# Patient Record
Sex: Female | Born: 1995 | Race: White | Hispanic: No | Marital: Single
Health system: Southern US, Community
[De-identification: ages and names within clinical notes are randomized; demographics above are authoritative.]

---
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
## 2018-11-21 ENCOUNTER — Telehealth: Payer: Self-pay | Admitting: *Deleted

## 2018-11-21 NOTE — Telephone Encounter (Signed)
Received a message from pt mother Lark Langenfeld who is an established pt with Dr. Lindi Adie.  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  Per Dr. Lindi Adie, make a WebEx apt for pt for him to assess pt and to decide what to do based on assessment.  High priority message sent to scheduling for 4/2//2020 at 10:15 am.

## 2018-11-22 ENCOUNTER — Telehealth: Payer: Self-pay | Admitting: Hematology and Oncology

## 2018-11-22 NOTE — Telephone Encounter (Signed)
Lf the pt a vm to schedule an appt w/Dr.Gudena

## 2018-11-28 ENCOUNTER — Telehealth: Payer: Self-pay | Admitting: Hematology and Oncology

## 2018-11-28 NOTE — Telephone Encounter (Signed)
Called patient regarding upcoming Webex appointment, patient is notified and an e-mail has been sent.

## 2018-11-28 NOTE — Telephone Encounter (Signed)
A new high risk appt has been scheduled for the pt to see Dr. Pamelia Hoit on 4/22 at 1030am. Appt date and time has been given to the pt's mom. Will have Alli or Beverly set up a webex visit.

## 2018-11-28 NOTE — Progress Notes (Signed)
HEMATOLOGY-ONCOLOGY [place] VISIT CONSULT NOTE  I connected with Jenna Mcdaniel on 11/29/2018 at 10:30 AM EDT by Webex video conference and verified that I am speaking with the correct person using two identifiers.  I discussed the limitations, risks, security and privacy concerns of performing an evaluation and management service by Webex and the availability of in person appointments.  I also discussed with the patient that there may be a patient responsible charge related to this service. The patient expressed understanding and agreed to proceed.  Patient's Location: Home Physician Location: Clinic  Patient Care Team: Patient, No Pcp Per as PCP - General (General Practice)   CHIEF COMPLIANT/PURPOSE OF CONSULTATION: Newly diagnosed high risk for breast cancer  HISTORY OF PRESENTING ILLNESS: Jenna Mcdaniel is a 23 y.o. female who presents today due to a recent diagnosis of high risk for breast cancer. She was referred by her mother, who is a patient of mine, and has a history of breast cancer, positive for the PALB2 mutation. She presents today over Webex for initial evaluation.   I reviewed her records extensively and collaborated the history with the patient.  MEDICAL HISTORY: No past medical problems SURGICAL HISTORY: No prior surgeries  SOCIAL HISTORY: Denies any tobacco alcohol or recreational drug use FAMILY HISTORY: Mother with PALB 2 mutation ALLERGIES: is allergic to has no allergies on file.  MEDICATIONS: Does not take any medications  REVIEW OF SYSTEMS:   Constitutional: Denies fevers, chills or abnormal weight loss Eyes: Denies blurriness of vision Ears, nose, mouth, throat, and face: Denies mucositis or sore throat Respiratory: Denies cough, dyspnea or wheezes Cardiovascular: Denies palpitation, chest discomfort Gastrointestinal:  Denies nausea, heartburn or change in bowel habits Skin: Denies abnormal skin rashes Lymphatics: Denies new lymphadenopathy or easy bruising  Neurological:Denies numbness, tingling or new weaknesses Behavioral/Psych: Mood is stable, no new changes  Extremities: No lower extremity edema Breast: Lumps in bilateral breasts for the past 2 years that are stable All other systems were reviewed with the patient and are negative.  Observations/Objective: Stable lumps in bilateral breast for the past 2 years  No results found for: WBC, HGB, HCT, MCV, PLT, NEUTROABS    Assessment Plan:  At high risk for breast cancer Patients mom with PALB2 mutation and breast cancer. (patient of mine).  Breast lumps: 1. Mammograms and ultrasound will be ordered for evaluation of the cysts in the breast 2. Breast MRI: Only if she is positive for BRCA mutation 3. Genetics evaluation and testing: I discussed with Santiago Glad who is willing to set up a video visit for them.  I educated the patient about PALB 2 mutation  PALB2 (partner and localizer of BRCA2):  It is a gene that functions in DNA double strand brake repair similar to BRCA gene. It encodes a protein that functions in genome maintenance.  Clinical significance: 1.  Risk of breast cancer:The risk of breast cancer for female PALB2 mutation carriers, as compared with the general population, with 8-9 times as high among those younger than 25 years of age, 6-8 times as high among those 67-7 years of age, and 5 times as high among those older than 25 years of age. The cumulative breast cancer risk among female PALB2 mutation carriers is estimated to be increased by 2-4 fold by age 41, which translates to an 18-35% risk of breast cancer by age 39 based on a general female population risk of 8.8% to age 53.  2.  Risk of pancreatic cancer: The exact cumulative pancreatic cancer  risk among PALB2 mutation carriers has not been determined. The magnitude of risk increases with the number of affected relatives, with the highest risk (32-fold) in individuals with three affected first-degree relatives.  3. Risk  of Ovarian cancer:  Also not properly documented how much the increased risk is.  I discussed with the patient that if she does not have the mutation then she does not need annual MRIs.  She can go to standard surveillance with mammograms starting at age 23. If she does have the mutation then either as we or her PCP/GYN can order these tests annually for surveillance   I discussed the assessment and treatment plan with the patient. The patient was provided an opportunity to ask questions and all were answered. The patient agreed with the plan and demonstrated an understanding of the instructions. The patient was advised to call back or seek an in-person evaluation if the symptoms worsen or if the condition fails to improve as anticipated.   I provided 26 minutes of face-to-face Web Ex time during this encounter.    Rulon Eisenmenger, MD 11/29/2018   I, Molly Dorshimer, am acting as scribe for Nicholas Lose, MD.  I have reviewed the above documentation for accuracy and completeness, and I agree with the above.

## 2018-11-29 ENCOUNTER — Other Ambulatory Visit: Payer: Self-pay | Admitting: Hematology and Oncology

## 2018-11-29 ENCOUNTER — Inpatient Hospital Stay: Payer: 59 | Attending: Hematology and Oncology | Admitting: Hematology and Oncology

## 2018-11-29 DIAGNOSIS — N6001 Solitary cyst of right breast: Secondary | ICD-10-CM

## 2018-11-29 DIAGNOSIS — N6002 Solitary cyst of left breast: Secondary | ICD-10-CM

## 2018-11-29 DIAGNOSIS — N63 Unspecified lump in unspecified breast: Secondary | ICD-10-CM

## 2018-11-29 DIAGNOSIS — Z9189 Other specified personal risk factors, not elsewhere classified: Secondary | ICD-10-CM

## 2018-11-29 NOTE — Assessment & Plan Note (Signed)
Patients mom with PALB2 mutation and breast cancer. (patient of mine). Breast lumps: 1. Mammograms 2. Breast MRI 3. Genetics evaluation and testing  PALB2 (partner and localizer of BRCA2):  It is a gene that functions in DNA double strand brake repair similar to BRCA gene. It encodes a protein that functions in genome maintenance.  Clinical significance: 1.  Risk of breast cancer:The risk of breast cancer for female PALB2 mutation carriers, as compared with the general population, with 8-9 times as high among those younger than 25 years of age, 6-8 times as high among those 23-39 years of age, and 5 times as high among those older than 25 years of age. The cumulative breast cancer risk among female PALB2 mutation carriers is estimated to be increased by 2-4 fold by age 51, which translates to an 18-35% risk of breast cancer by age 56 based on a general female population risk of 8.8% to age 7.  2.  Risk of pancreatic cancer: The exact cumulative pancreatic cancer risk among PALB2 mutation carriers has not been determined. The magnitude of risk increases with the number of affected relatives, with the highest risk (32-fold) in individuals with three affected first-degree relatives.  3. Risk of Ovarian cancer:  Also not properly documented how much the increased risk is.   RTC annually for follow ups

## 2018-11-30 ENCOUNTER — Telehealth: Payer: Self-pay | Admitting: Genetic Counselor

## 2018-11-30 NOTE — Telephone Encounter (Signed)
LM on VM that I was calling to set up an appointment.  Asked that she CB.

## 2018-12-06 ENCOUNTER — Inpatient Hospital Stay: Payer: 59 | Admitting: Genetic Counselor

## 2018-12-18 ENCOUNTER — Inpatient Hospital Stay: Payer: 59 | Attending: Hematology and Oncology | Admitting: Genetic Counselor

## 2019-01-04 ENCOUNTER — Inpatient Hospital Stay: Admission: RE | Admit: 2019-01-04 | Payer: 59 | Source: Ambulatory Visit

## 2019-01-04 ENCOUNTER — Other Ambulatory Visit: Payer: 59

## 2019-01-09 ENCOUNTER — Other Ambulatory Visit: Payer: Self-pay | Admitting: Hematology and Oncology

## 2019-01-09 ENCOUNTER — Other Ambulatory Visit: Payer: Self-pay

## 2019-01-09 ENCOUNTER — Ambulatory Visit
Admission: RE | Admit: 2019-01-09 | Discharge: 2019-01-09 | Disposition: A | Discharge status: Home / Self Care | Payer: 59 | Source: Ambulatory Visit | Attending: Hematology and Oncology | Admitting: Hematology and Oncology

## 2019-01-09 DIAGNOSIS — Z9189 Other specified personal risk factors, not elsewhere classified: Secondary | ICD-10-CM

## 2019-01-09 DIAGNOSIS — N6001 Solitary cyst of right breast: Secondary | ICD-10-CM

## 2019-07-12 ENCOUNTER — Ambulatory Visit
Admission: RE | Admit: 2019-07-12 | Discharge: 2019-07-12 | Disposition: A | Discharge status: Home / Self Care | Payer: 59 | Source: Ambulatory Visit | Attending: Hematology and Oncology | Admitting: Hematology and Oncology

## 2019-07-12 ENCOUNTER — Other Ambulatory Visit: Payer: Self-pay | Admitting: Hematology and Oncology

## 2019-07-12 ENCOUNTER — Other Ambulatory Visit: Payer: Self-pay

## 2019-07-12 DIAGNOSIS — Z9189 Other specified personal risk factors, not elsewhere classified: Secondary | ICD-10-CM

## 2019-07-12 DIAGNOSIS — N63 Unspecified lump in unspecified breast: Secondary | ICD-10-CM

## 2020-01-14 ENCOUNTER — Inpatient Hospital Stay: Admission: RE | Admit: 2020-01-14 | Payer: 59 | Source: Ambulatory Visit

## 2020-01-14 ENCOUNTER — Other Ambulatory Visit: Payer: 59

## 2020-05-09 IMAGING — US ULTRASOUND RIGHT BREAST LIMITED
1 series · 5 of 5 positions shown · non-contrast
Comparison: None
COMPARISON: None

Addendum:
CLINICAL DATA: [age] year patient with family history of PALB
2 mutation presents for evaluation a discrete palpable mass in the 8
o'clock region of the right breast. She thinks that the mass has
been stable in size for approximately 2 years. She has never had
imaging of this mass before. There is also a more vague palpable
area of concern in the upper-outer left breast.

EXAM:
ULTRASOUND OF THE LEFT BREAST

[Series 1: ultrasound right breast limited · 0.06mm/px · 5 of 5 slices shown]
[im 1/5]
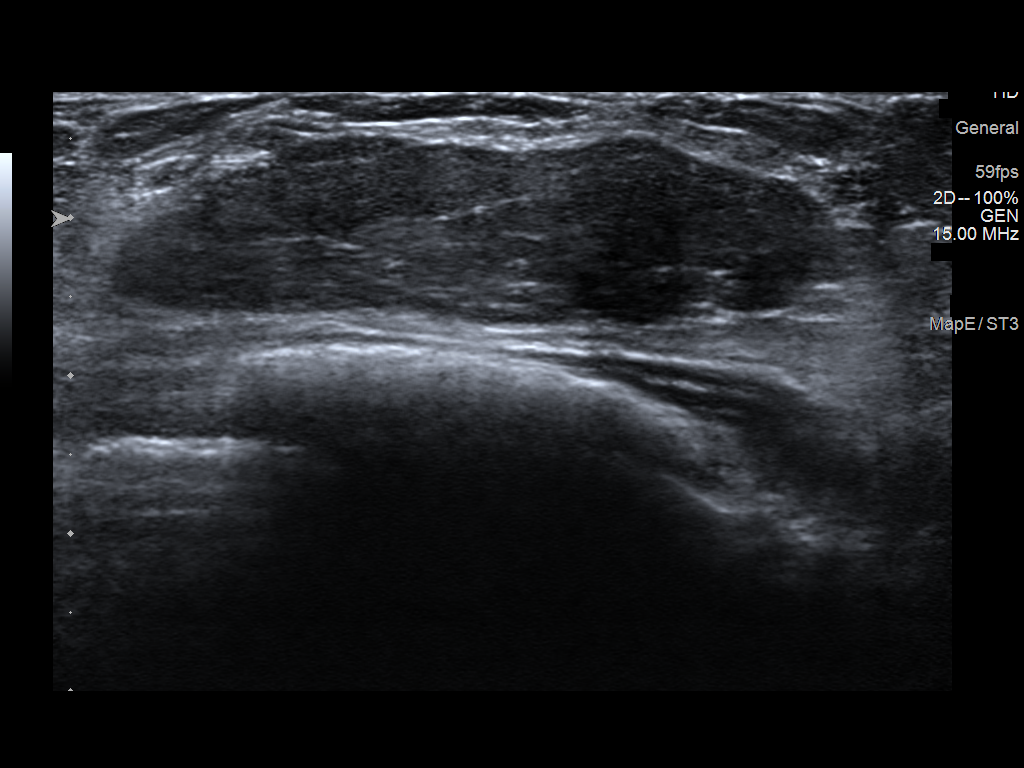
[im 2/5]
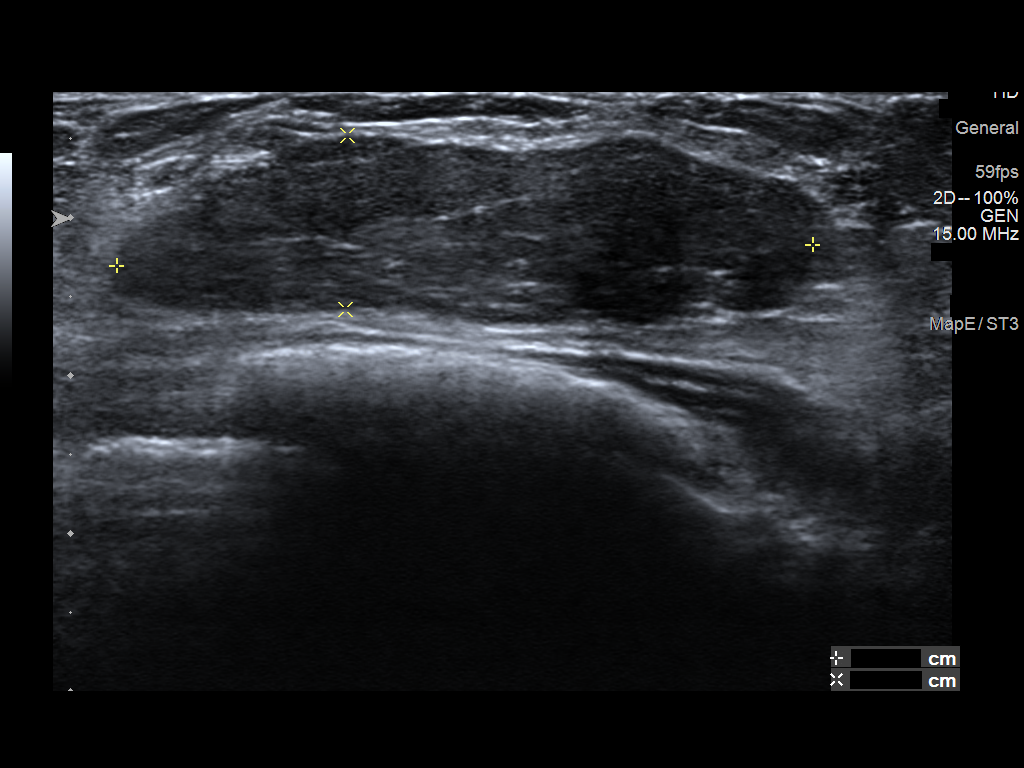
[im 3/5]
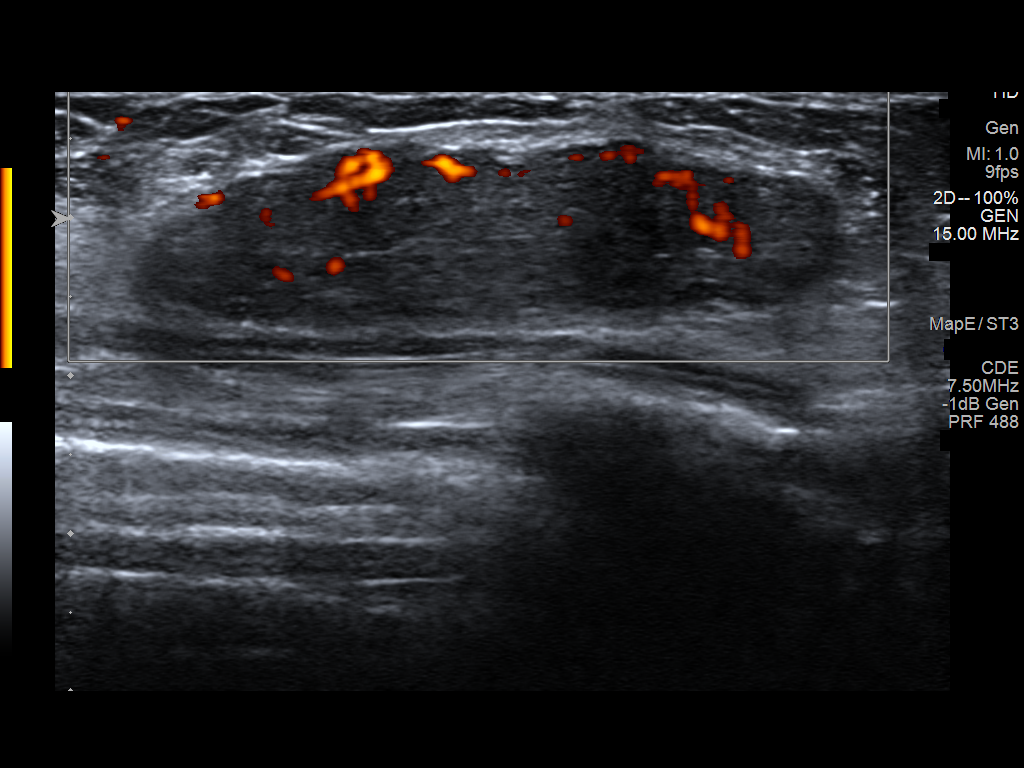
[im 4/5]
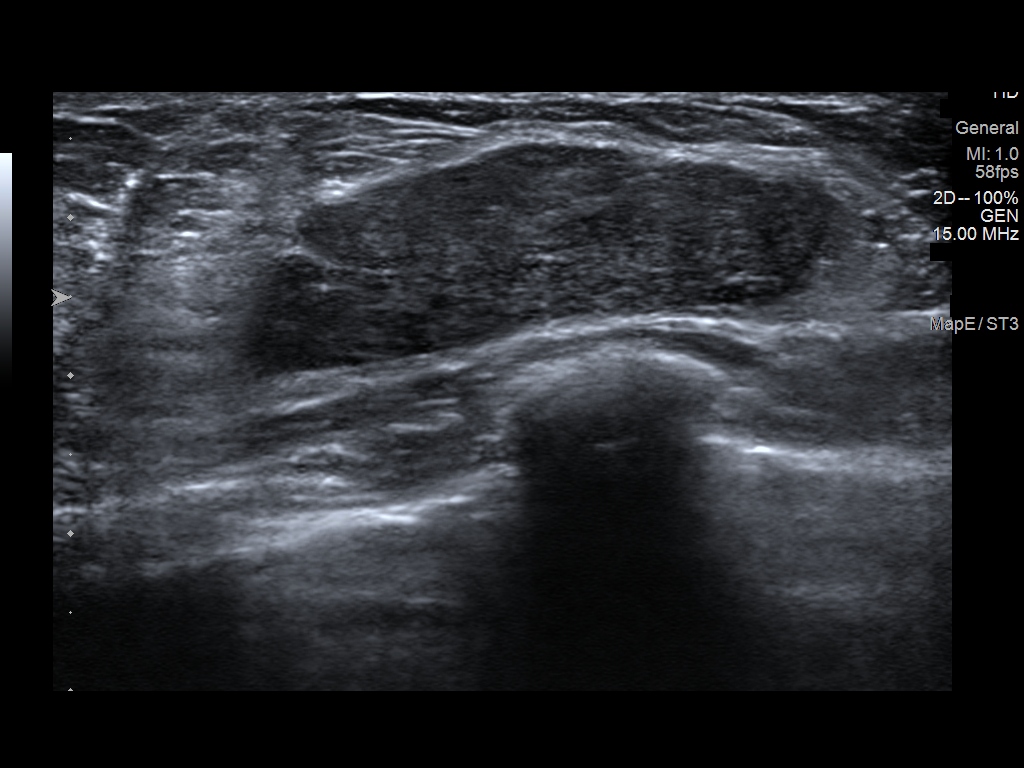
[im 5/5]
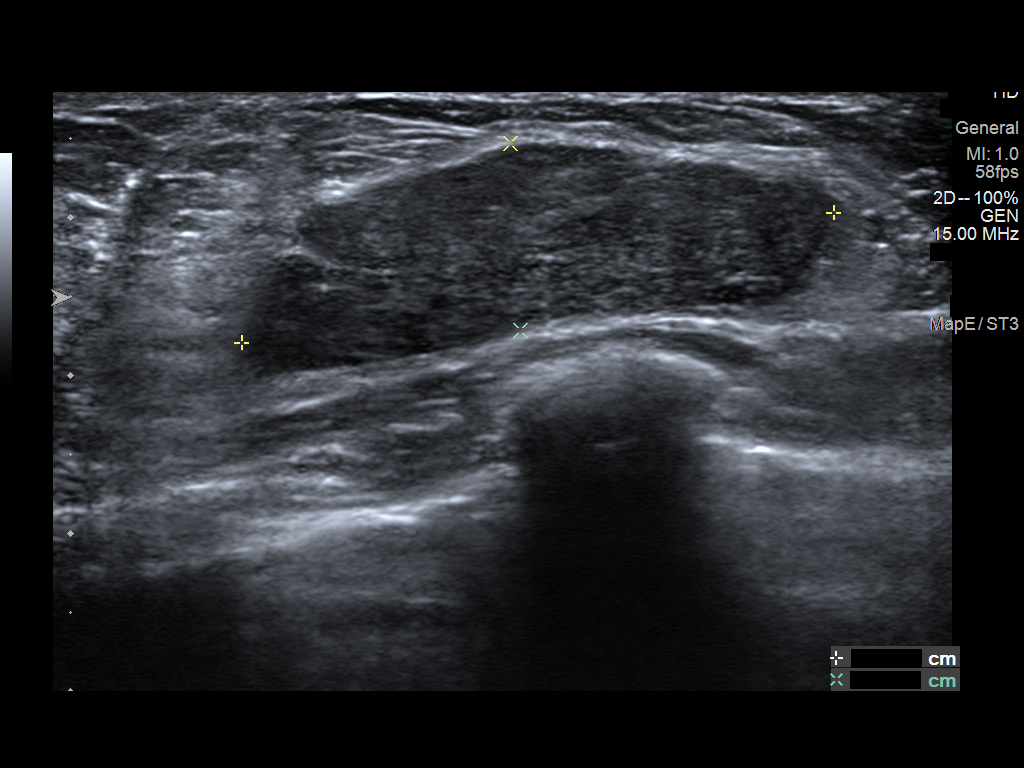

[5 of 5 positions shown; findings below may reference images not displayed]

FINDINGS: Targeted ultrasound is performed, showing a circumscribed oval
parallel hypoechoic mass with internal echogenic bands at 8 o'clock
position 4 cm from the nipple in the right breast. This mass is
palpable and measures 4.4 x 1.0 x 3.8 cm.

In the left breast an incidental oval circumscribed hypoechoic mass
is noted at 2 o'clock position 3 cm from the nipple measuring 0.7 x
0.4 x 0.8 cm.
IMPRESSION: 1. Palpable probable fibroadenoma in the right breast at 8 o'clock
position.
2. Nonpalpable probable fibroadenoma left breast 2 o'clock position.

RECOMMENDATION:
Ultrasound follow-up is recommended in 6, 12, and 24 months to
assess for stability of the bilateral breast masses.

Bilateral breast ultrasound is recommended in 6 months.

I have discussed the findings and recommendations with the patient.
Results were also provided in writing at the conclusion of the
visit. If applicable, a reminder letter will be sent to the patient
regarding the next appointment.

BI-RADS CATEGORY  3: Probably benign.

ADDENDUM:
This addendum is to correct the order of this exam. A bilateral exam
was performed and should read as follows:

EXAM:

ULTRASOUND OF THE BILATERAL BREASTS

*** End of Addendum ***
FINDINGS: Targeted ultrasound is performed, showing a circumscribed oval
parallel hypoechoic mass with internal echogenic bands at 8 o'clock
position 4 cm from the nipple in the right breast. This mass is
palpable and measures 4.4 x 1.0 x 3.8 cm.

In the left breast an incidental oval circumscribed hypoechoic mass
is noted at 2 o'clock position 3 cm from the nipple measuring 0.7 x
0.4 x 0.8 cm.
IMPRESSION: 1. Palpable probable fibroadenoma in the right breast at 8 o'clock
position.
2. Nonpalpable probable fibroadenoma left breast 2 o'clock position.

RECOMMENDATION:
Ultrasound follow-up is recommended in 6, 12, and 24 months to
assess for stability of the bilateral breast masses.

Bilateral breast ultrasound is recommended in 6 months.

I have discussed the findings and recommendations with the patient.
Results were also provided in writing at the conclusion of the
visit. If applicable, a reminder letter will be sent to the patient
regarding the next appointment.

BI-RADS CATEGORY  3: Probably benign.

## 2020-05-09 IMAGING — US ULTRASOUND LEFT BREAST LIMITED
1 series · 6 of 6 positions shown · non-contrast
Comparison: None
COMPARISON: None

Addendum:
CLINICAL DATA: [age] year patient with family history of PALB
2 mutation presents for evaluation a discrete palpable mass in the 8
o'clock region of the right breast. She thinks that the mass has
been stable in size for approximately 2 years. She has never had
imaging of this mass before. There is also a more vague palpable
area of concern in the upper-outer left breast.

EXAM:
ULTRASOUND OF THE LEFT BREAST

[Series 1: ultrasound left breast limited · 0.06mm/px · 6 of 6 slices shown]
[im 1/6]
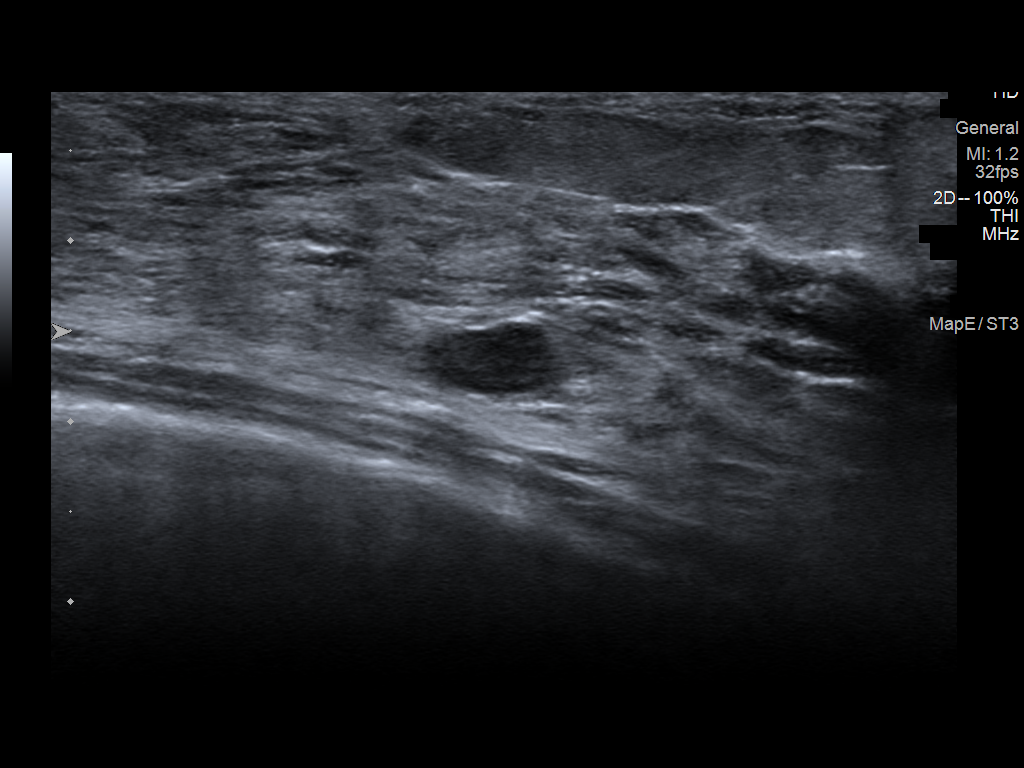
[im 2/6]
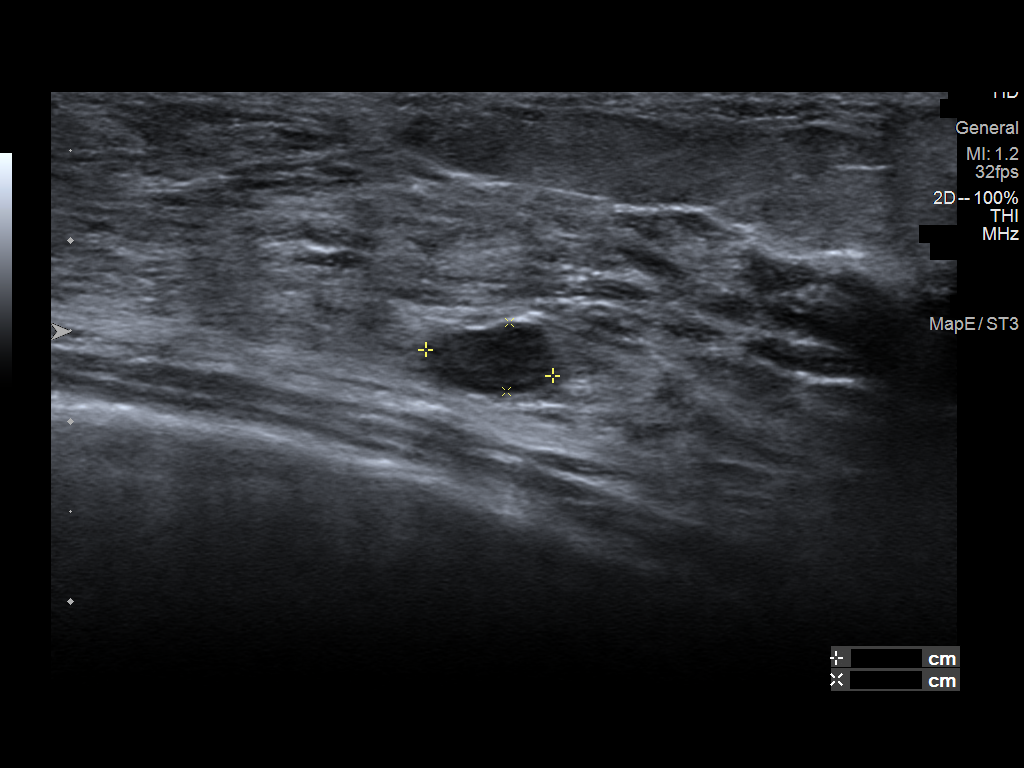
[im 3/6]
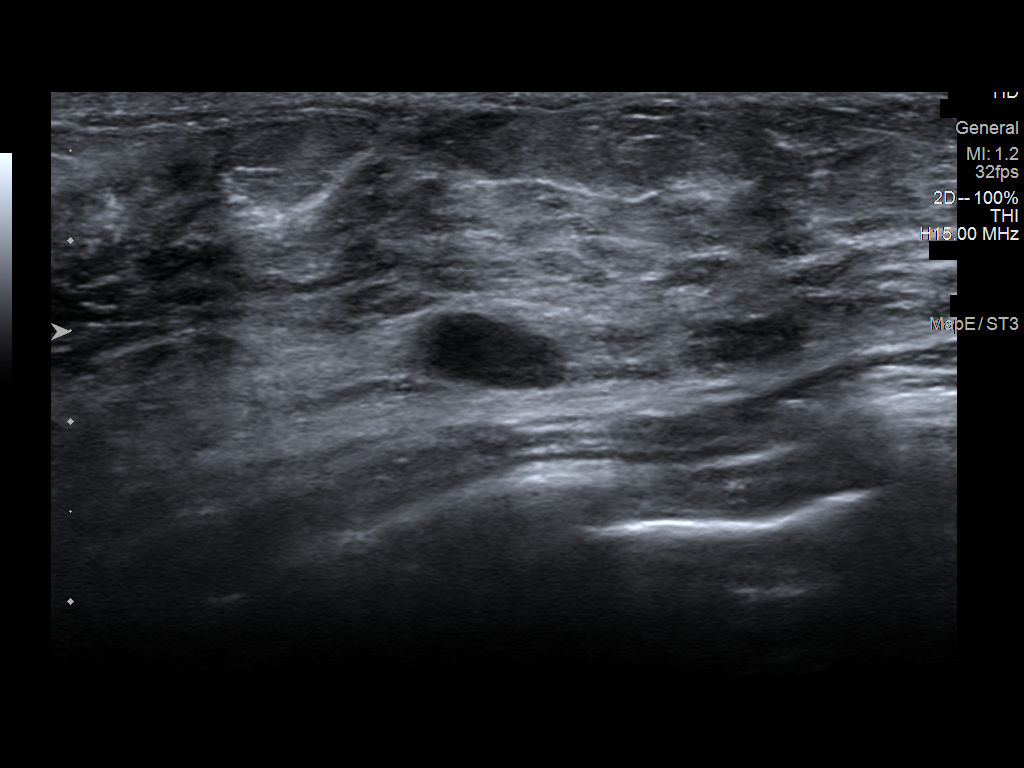
[im 4/6]
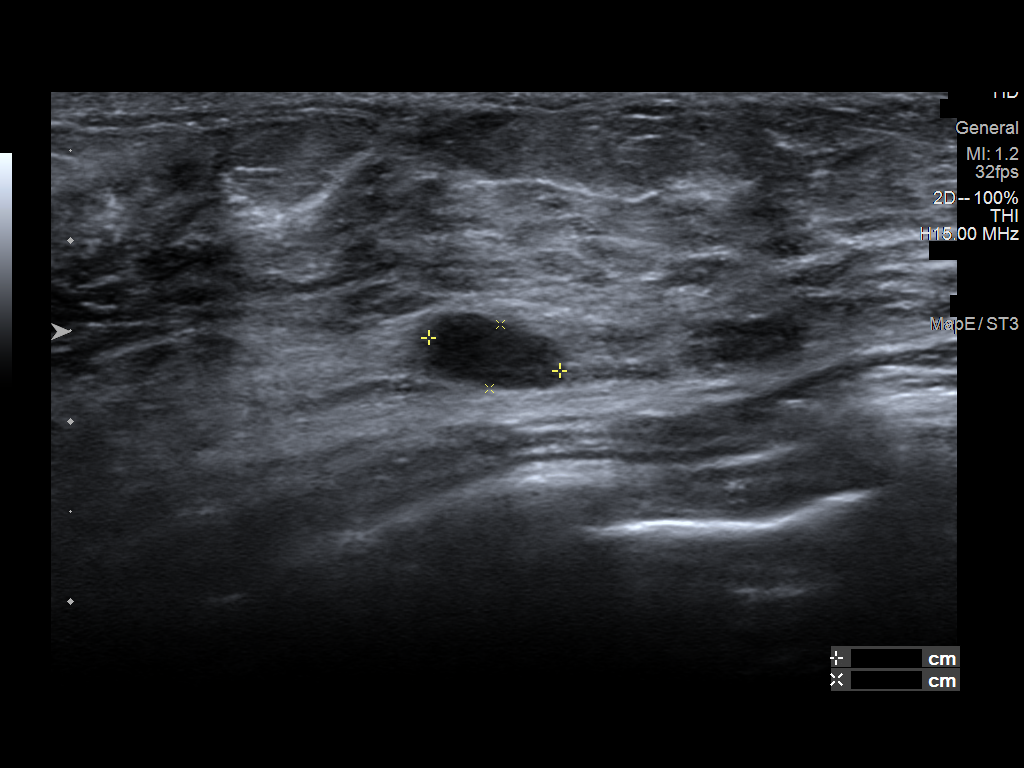
[im 5/6]
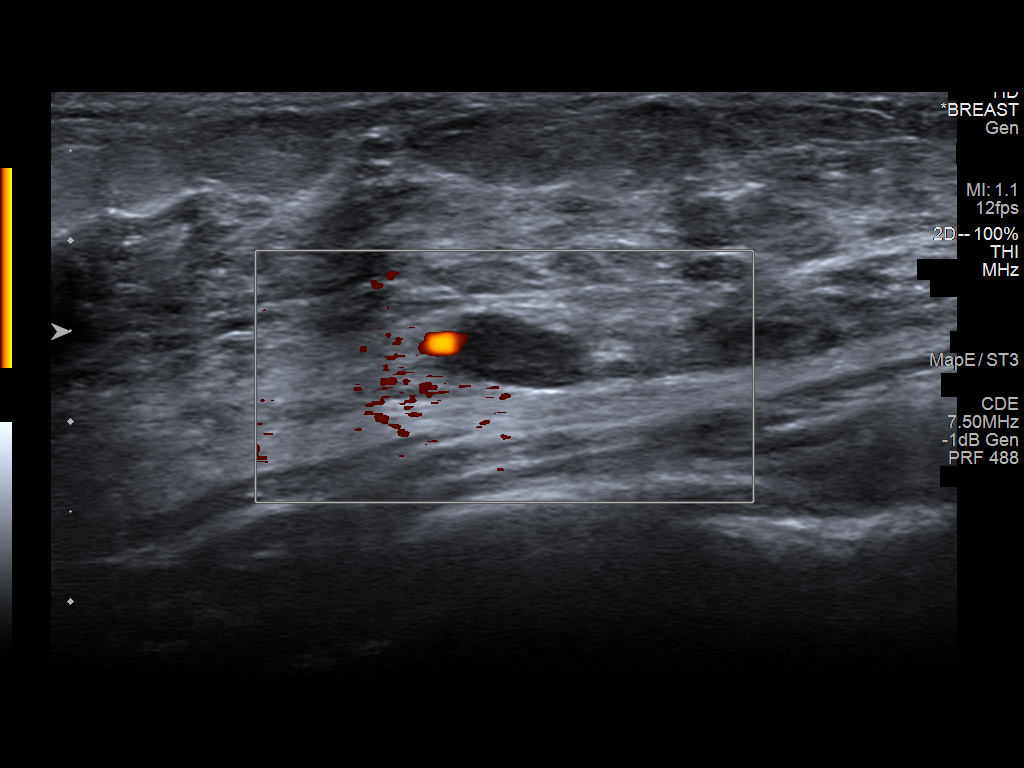
[im 6/6]
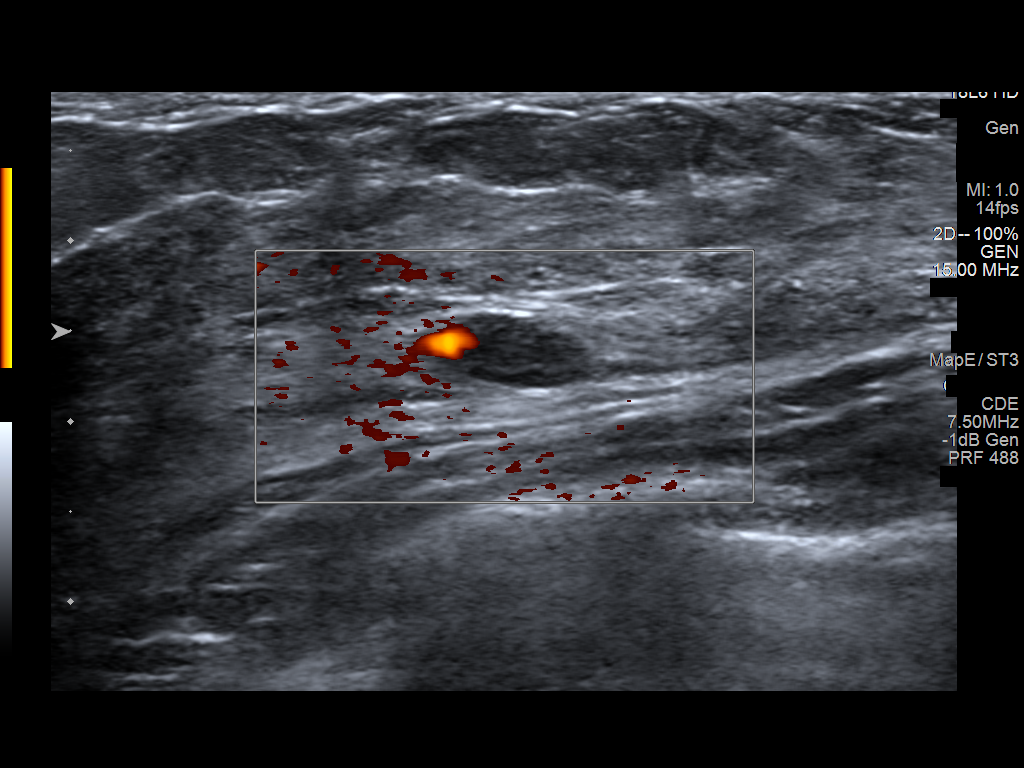

[6 of 6 positions shown; findings below may reference images not displayed]

FINDINGS: Targeted ultrasound is performed, showing a circumscribed oval
parallel hypoechoic mass with internal echogenic bands at 8 o'clock
position 4 cm from the nipple in the right breast. This mass is
palpable and measures 4.4 x 1.0 x 3.8 cm.

In the left breast an incidental oval circumscribed hypoechoic mass
is noted at 2 o'clock position 3 cm from the nipple measuring 0.7 x
0.4 x 0.8 cm.
IMPRESSION: 1. Palpable probable fibroadenoma in the right breast at 8 o'clock
position.
2. Nonpalpable probable fibroadenoma left breast 2 o'clock position.

RECOMMENDATION:
Ultrasound follow-up is recommended in 6, 12, and 24 months to
assess for stability of the bilateral breast masses.

Bilateral breast ultrasound is recommended in 6 months.

I have discussed the findings and recommendations with the patient.
Results were also provided in writing at the conclusion of the
visit. If applicable, a reminder letter will be sent to the patient
regarding the next appointment.

BI-RADS CATEGORY  3: Probably benign.

ADDENDUM:
This addendum is to correct the order of this exam. A bilateral exam
was performed and should read as follows:

EXAM:

ULTRASOUND OF THE BILATERAL BREASTS

*** End of Addendum ***
FINDINGS: Targeted ultrasound is performed, showing a circumscribed oval
parallel hypoechoic mass with internal echogenic bands at 8 o'clock
position 4 cm from the nipple in the right breast. This mass is
palpable and measures 4.4 x 1.0 x 3.8 cm.

In the left breast an incidental oval circumscribed hypoechoic mass
is noted at 2 o'clock position 3 cm from the nipple measuring 0.7 x
0.4 x 0.8 cm.
IMPRESSION: 1. Palpable probable fibroadenoma in the right breast at 8 o'clock
position.
2. Nonpalpable probable fibroadenoma left breast 2 o'clock position.

RECOMMENDATION:
Ultrasound follow-up is recommended in 6, 12, and 24 months to
assess for stability of the bilateral breast masses.

Bilateral breast ultrasound is recommended in 6 months.

I have discussed the findings and recommendations with the patient.
Results were also provided in writing at the conclusion of the
visit. If applicable, a reminder letter will be sent to the patient
regarding the next appointment.

BI-RADS CATEGORY  3: Probably benign.

## 2020-11-09 IMAGING — US US BREAST*L* LIMITED INC AXILLA
1 series · 5 of 5 positions shown · non-contrast
Comparison: 01/09/2019

CLINICAL DATA: Patient presents for first six-month follow-up of
probably benign masses in each breast. She reports no interval
change to her physical exam.

EXAM:
ULTRASOUND OF THE BILATERAL BREAST

[Series 1: us breast*left* limited inc axilla · 0.06mm/px · 5 of 5 slices shown]
[im 1/5]
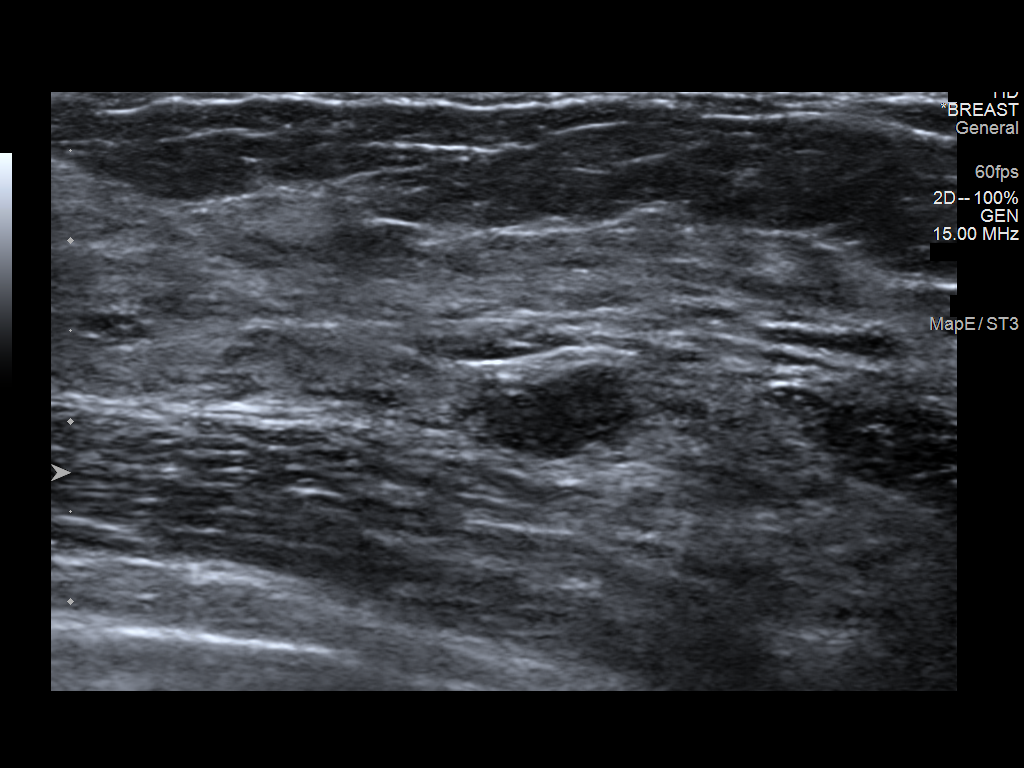
[im 2/5]
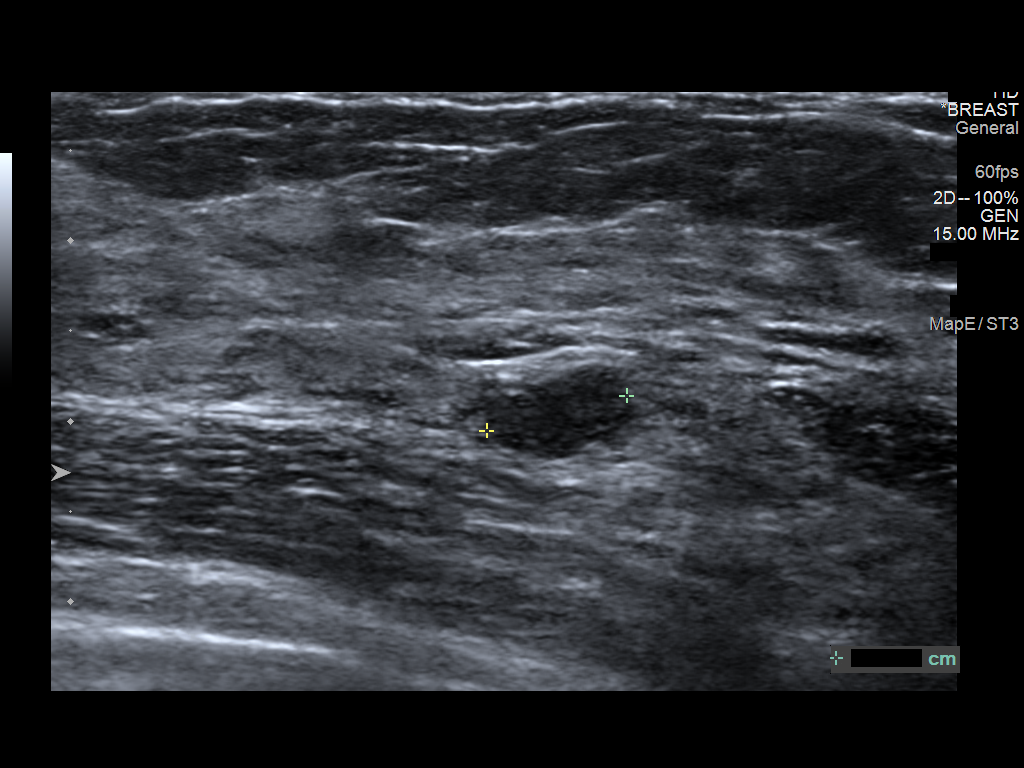
[im 3/5]
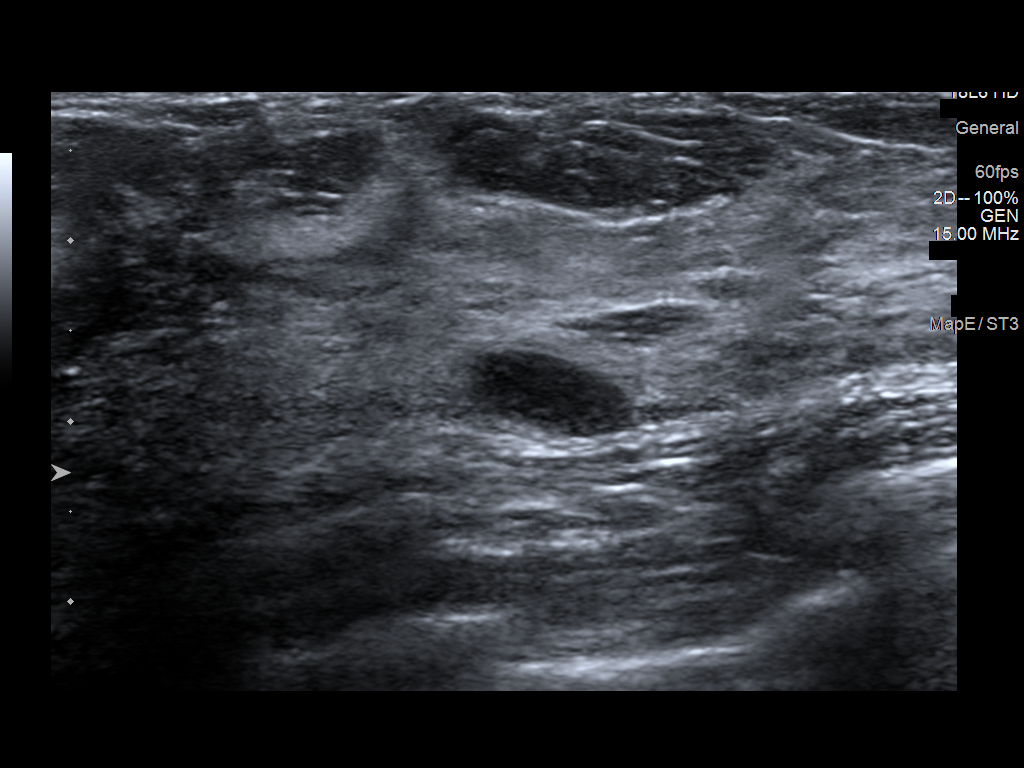
[im 4/5]
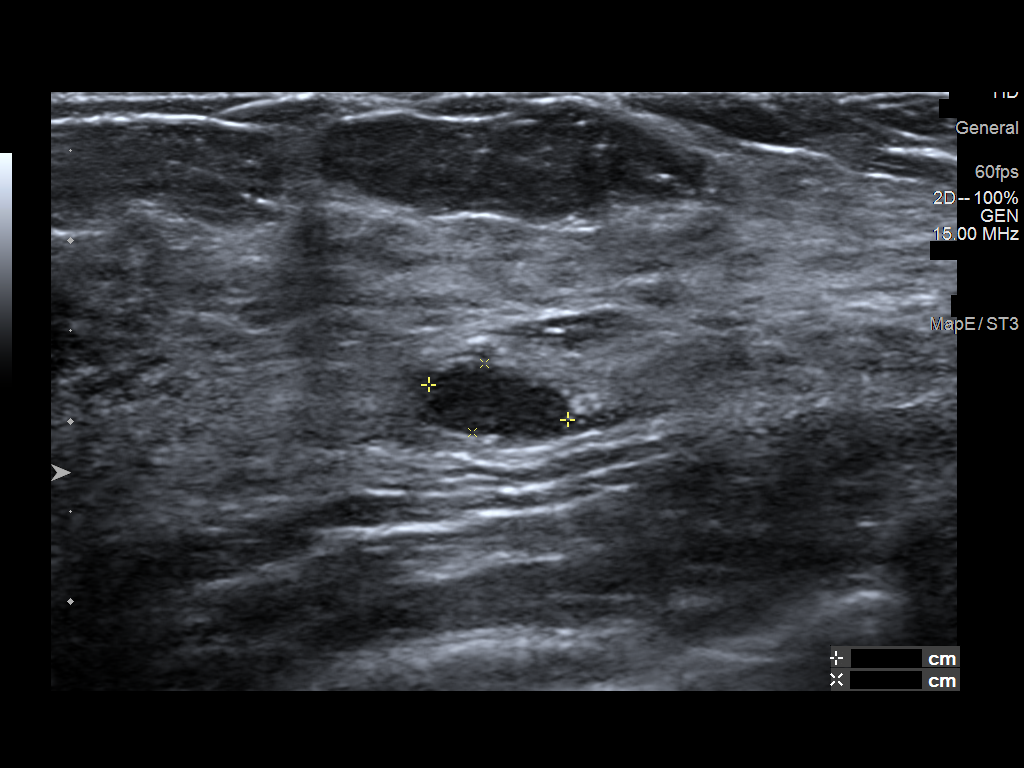
[im 5/5]
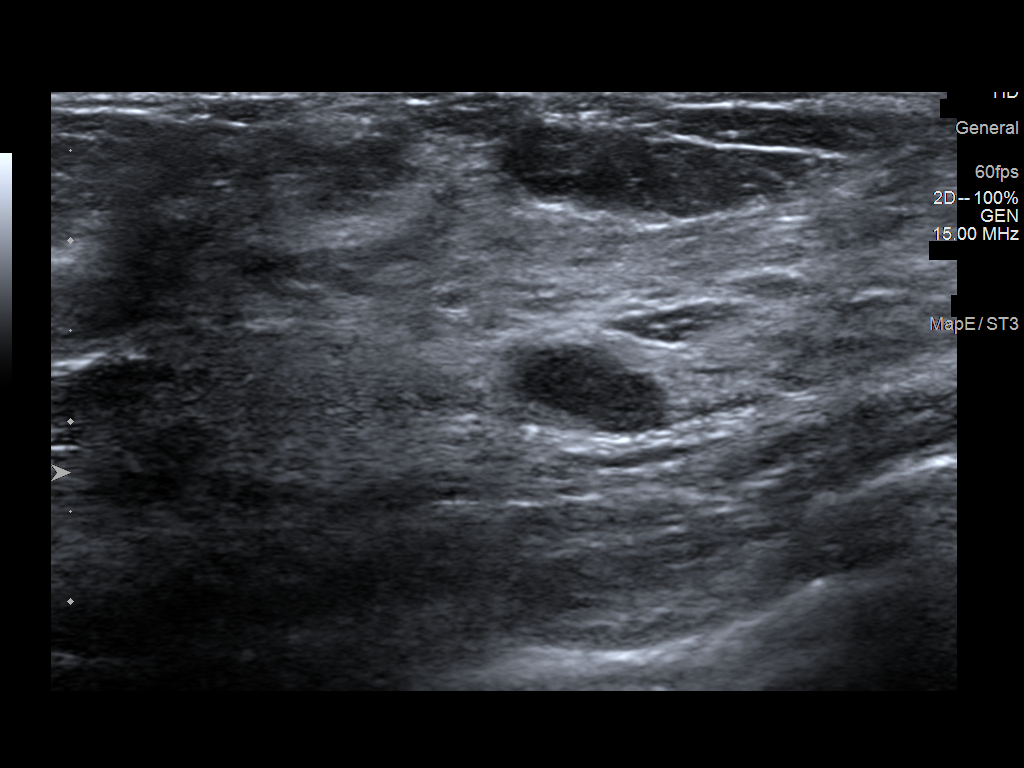

[5 of 5 positions shown; findings below may reference images not displayed]

FINDINGS: RIGHT breast: Targeted ultrasound is performed, showing a
circumscribed oval parallel hypoechoic mass in the 8 o'clock
location of the RIGHT breast 4 centimeters from the nipple. There is
associated internal blood flow. Mass measures 4.3 x 1.1 x
centimeters. Previously mass measured 4.4 x 1.0 x 3.8 centimeters.

LEFT breast: Targeted ultrasound is performed, showing a
circumscribed oval hypoechoic parallel mass in the 2 o'clock
location of the LEFT breast 3 centimeters from the nipple which
measures 0.8 x 0.8 x 0.4 centimeters. Previously mass measured 0.7 x
0.4 x 0.8 centimeters.
IMPRESSION: Stable appearance of probable fibroadenomas bilaterally. No
ultrasound evidence for malignancy.

RECOMMENDATION:
Bilateral breast ultrasound is recommended in 6 months.

I have discussed the findings and recommendations with the patient.
If applicable, a reminder letter will be sent to the patient
regarding the next appointment.

BI-RADS CATEGORY  3: Probably benign.

## 2020-11-09 IMAGING — US US BREAST*R* LIMITED INC AXILLA
1 series · 5 of 5 positions shown · non-contrast
Comparison: 01/09/2019

CLINICAL DATA: Patient presents for first six-month follow-up of
probably benign masses in each breast. She reports no interval
change to her physical exam.

EXAM:
ULTRASOUND OF THE BILATERAL BREAST

[Series 1: us breast*right* limited inc axilla · 0.07mm/px · 5 of 5 slices shown]
[im 1/5]
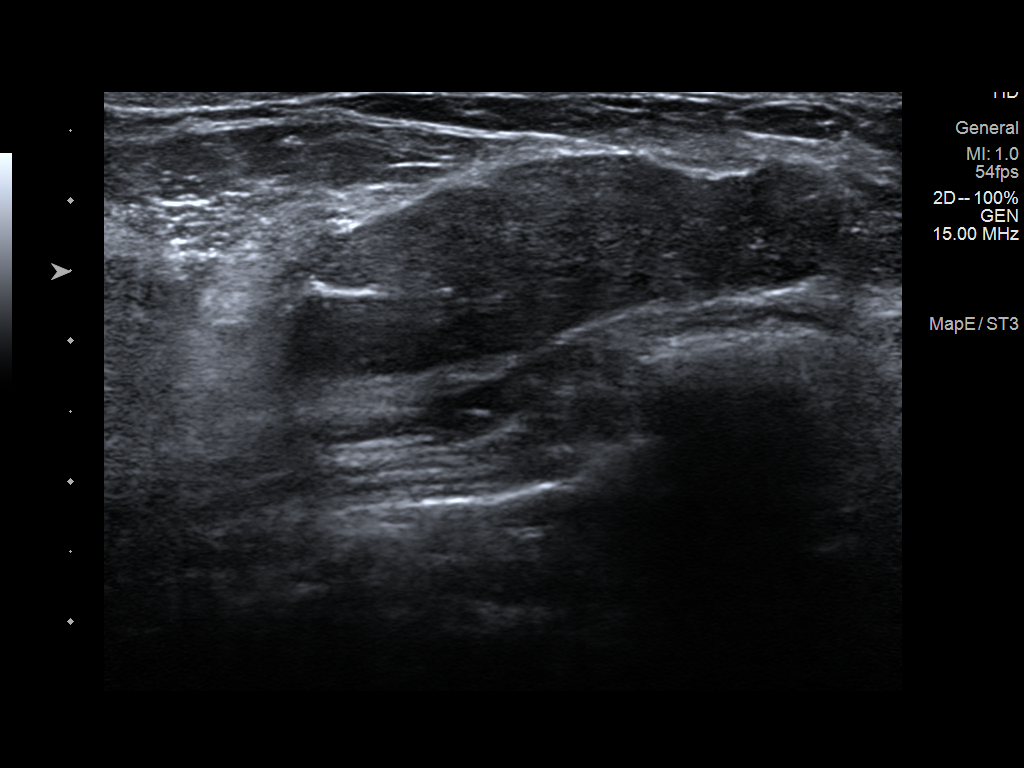
[im 2/5]
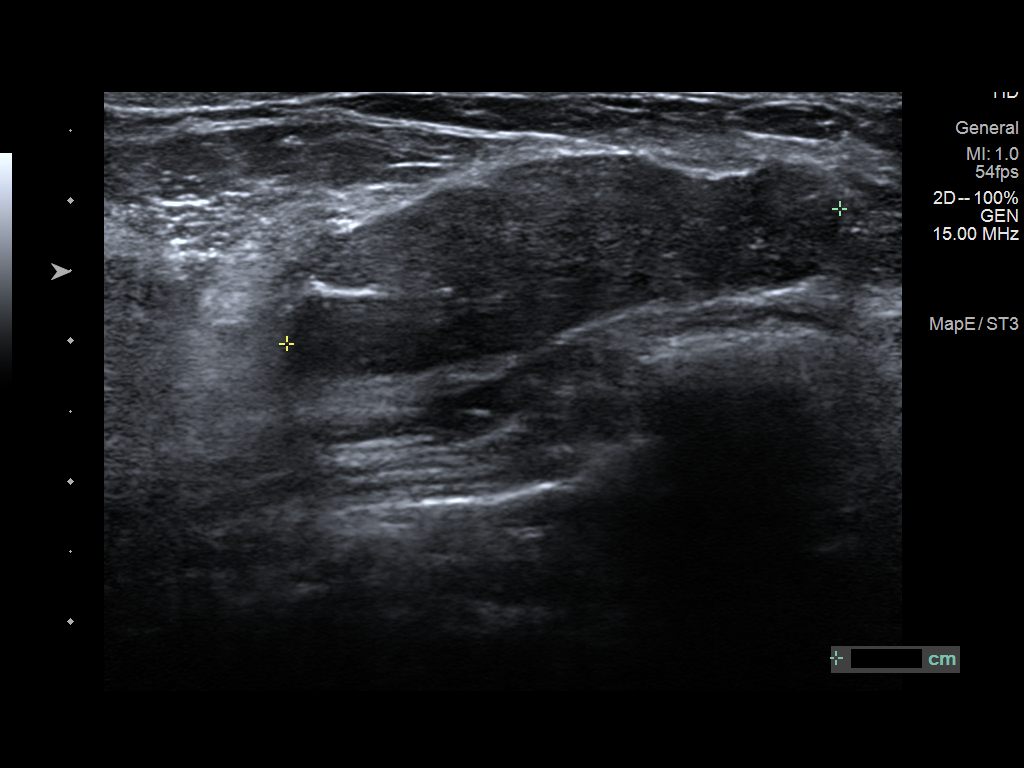
[im 3/5]
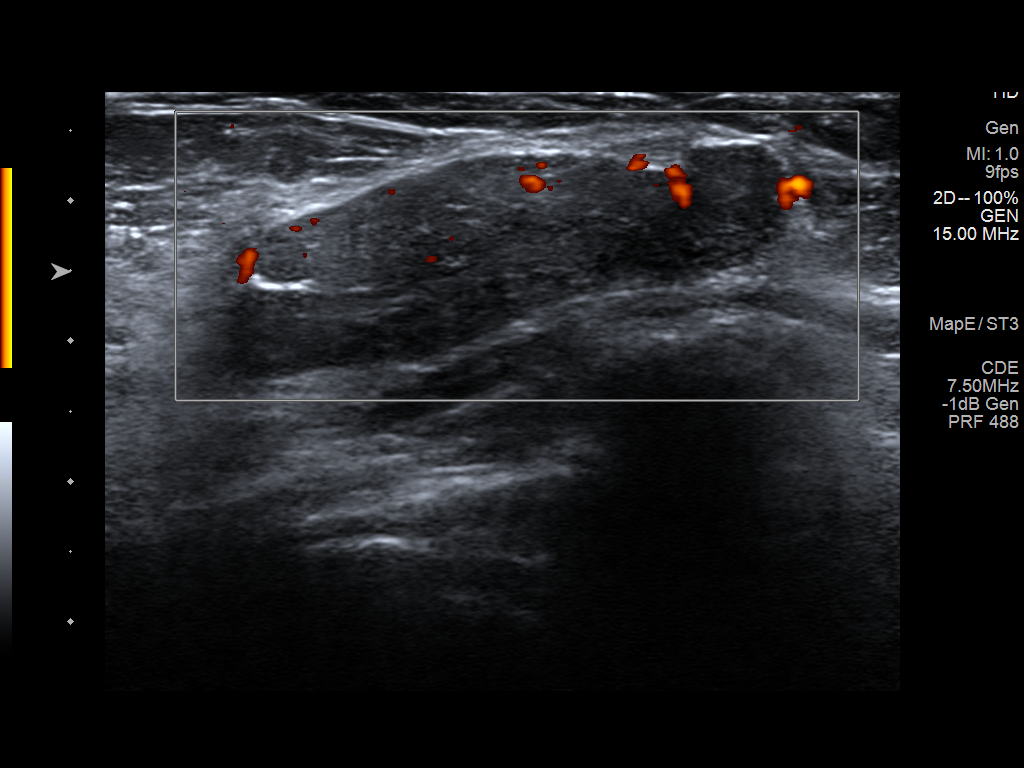
[im 4/5]
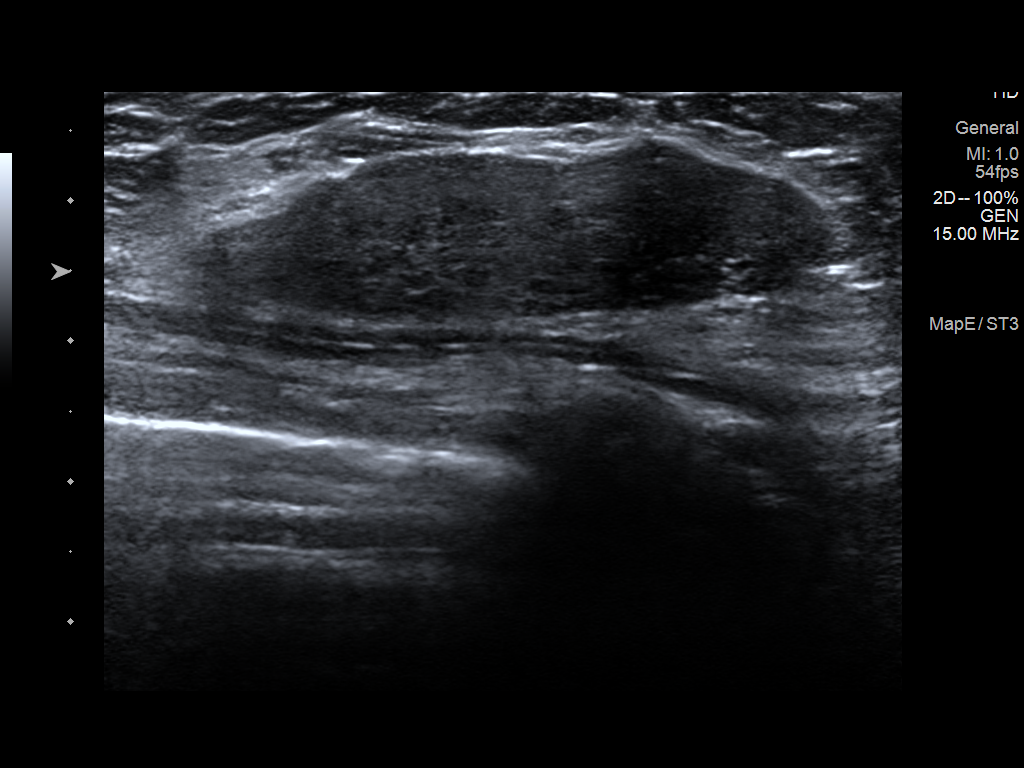
[im 5/5]
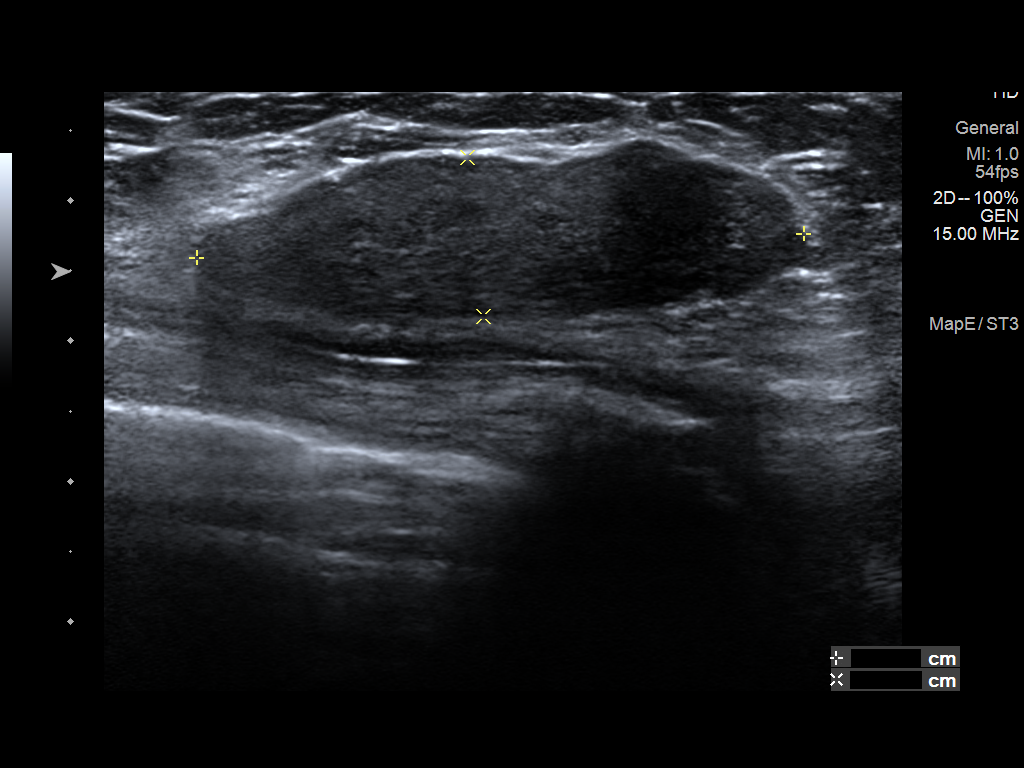

[5 of 5 positions shown; findings below may reference images not displayed]

FINDINGS: RIGHT breast: Targeted ultrasound is performed, showing a
circumscribed oval parallel hypoechoic mass in the 8 o'clock
location of the RIGHT breast 4 centimeters from the nipple. There is
associated internal blood flow. Mass measures 4.3 x 1.1 x
centimeters. Previously mass measured 4.4 x 1.0 x 3.8 centimeters.

LEFT breast: Targeted ultrasound is performed, showing a
circumscribed oval hypoechoic parallel mass in the 2 o'clock
location of the LEFT breast 3 centimeters from the nipple which
measures 0.8 x 0.8 x 0.4 centimeters. Previously mass measured 0.7 x
0.4 x 0.8 centimeters.
IMPRESSION: Stable appearance of probable fibroadenomas bilaterally. No
ultrasound evidence for malignancy.

RECOMMENDATION:
Bilateral breast ultrasound is recommended in 6 months.

I have discussed the findings and recommendations with the patient.
If applicable, a reminder letter will be sent to the patient
regarding the next appointment.

BI-RADS CATEGORY  3: Probably benign.
